# Patient Record
Sex: Female | Born: 1965 | Race: White | Hispanic: No | Marital: Married | Smoking: Never smoker
Health system: Southern US, Community
[De-identification: ages and names within clinical notes are randomized; demographics above are authoritative.]

## PROBLEM LIST (undated history)

## (undated) DIAGNOSIS — Z8669 Personal history of other diseases of the nervous system and sense organs: Secondary | ICD-10-CM

## (undated) DIAGNOSIS — I10 Essential (primary) hypertension: Secondary | ICD-10-CM

## (undated) DIAGNOSIS — E785 Hyperlipidemia, unspecified: Secondary | ICD-10-CM

## (undated) DIAGNOSIS — J45909 Unspecified asthma, uncomplicated: Secondary | ICD-10-CM

## (undated) HISTORY — PX: ABDOMINAL HYSTERECTOMY: SHX81

## (undated) HISTORY — PX: BREAST BIOPSY: SHX20

---

## 2004-08-01 ENCOUNTER — Other Ambulatory Visit: Payer: Self-pay

## 2004-08-01 ENCOUNTER — Emergency Department: Payer: Self-pay | Admitting: Emergency Medicine

## 2005-12-15 ENCOUNTER — Ambulatory Visit: Payer: Self-pay | Admitting: Family Medicine

## 2006-01-03 ENCOUNTER — Ambulatory Visit: Payer: Self-pay | Admitting: Family Medicine

## 2006-12-20 ENCOUNTER — Ambulatory Visit: Payer: Self-pay | Admitting: Family Medicine

## 2007-01-09 ENCOUNTER — Ambulatory Visit: Payer: Self-pay | Admitting: Family Medicine

## 2007-09-07 ENCOUNTER — Ambulatory Visit: Payer: Self-pay | Admitting: Family Medicine

## 2008-01-10 ENCOUNTER — Ambulatory Visit: Payer: Self-pay | Admitting: Family Medicine

## 2009-01-21 ENCOUNTER — Ambulatory Visit: Payer: Self-pay | Admitting: Family Medicine

## 2012-02-06 ENCOUNTER — Ambulatory Visit: Payer: Self-pay

## 2012-05-30 ENCOUNTER — Ambulatory Visit: Payer: Self-pay | Admitting: Family Medicine

## 2014-03-25 ENCOUNTER — Ambulatory Visit: Payer: Self-pay | Admitting: Family Medicine

## 2015-06-25 ENCOUNTER — Other Ambulatory Visit: Payer: Self-pay | Admitting: Family Medicine

## 2015-06-25 DIAGNOSIS — Z1231 Encounter for screening mammogram for malignant neoplasm of breast: Secondary | ICD-10-CM

## 2015-07-13 ENCOUNTER — Ambulatory Visit
Admission: RE | Admit: 2015-07-13 | Discharge: 2015-07-13 | Disposition: A | Discharge status: Home / Self Care | Payer: Managed Care, Other (non HMO) | Source: Ambulatory Visit | Attending: Family Medicine | Admitting: Family Medicine

## 2015-07-13 DIAGNOSIS — Z1231 Encounter for screening mammogram for malignant neoplasm of breast: Secondary | ICD-10-CM | POA: Diagnosis not present

## 2017-04-19 ENCOUNTER — Other Ambulatory Visit: Payer: Self-pay | Admitting: Family Medicine

## 2017-04-19 DIAGNOSIS — Z1231 Encounter for screening mammogram for malignant neoplasm of breast: Secondary | ICD-10-CM

## 2017-05-02 ENCOUNTER — Other Ambulatory Visit: Payer: Self-pay | Admitting: Family Medicine

## 2017-05-02 ENCOUNTER — Ambulatory Visit
Admission: RE | Admit: 2017-05-02 | Discharge: 2017-05-02 | Disposition: A | Discharge status: Home / Self Care | Payer: Managed Care, Other (non HMO) | Source: Ambulatory Visit | Attending: Family Medicine | Admitting: Family Medicine

## 2017-05-02 DIAGNOSIS — Z1231 Encounter for screening mammogram for malignant neoplasm of breast: Secondary | ICD-10-CM | POA: Diagnosis not present

## 2017-12-18 ENCOUNTER — Ambulatory Visit: Payer: 59 | Admitting: Anesthesiology

## 2017-12-18 ENCOUNTER — Ambulatory Visit
Admission: RE | Admit: 2017-12-18 | Discharge: 2017-12-18 | Disposition: A | Discharge status: Home / Self Care | Payer: 59 | Source: Ambulatory Visit | Attending: Unknown Physician Specialty | Admitting: Unknown Physician Specialty

## 2017-12-18 ENCOUNTER — Encounter
Admission: RE | Disposition: A | Discharge status: Home / Self Care | Payer: Self-pay | Source: Ambulatory Visit | Attending: Unknown Physician Specialty

## 2017-12-18 DIAGNOSIS — J45909 Unspecified asthma, uncomplicated: Secondary | ICD-10-CM | POA: Insufficient documentation

## 2017-12-18 DIAGNOSIS — K5730 Diverticulosis of large intestine without perforation or abscess without bleeding: Secondary | ICD-10-CM | POA: Insufficient documentation

## 2017-12-18 DIAGNOSIS — E785 Hyperlipidemia, unspecified: Secondary | ICD-10-CM | POA: Diagnosis not present

## 2017-12-18 DIAGNOSIS — Z79899 Other long term (current) drug therapy: Secondary | ICD-10-CM | POA: Insufficient documentation

## 2017-12-18 DIAGNOSIS — K640 First degree hemorrhoids: Secondary | ICD-10-CM | POA: Insufficient documentation

## 2017-12-18 DIAGNOSIS — I10 Essential (primary) hypertension: Secondary | ICD-10-CM | POA: Diagnosis not present

## 2017-12-18 DIAGNOSIS — Z791 Long term (current) use of non-steroidal anti-inflammatories (NSAID): Secondary | ICD-10-CM | POA: Diagnosis not present

## 2017-12-18 DIAGNOSIS — Z1211 Encounter for screening for malignant neoplasm of colon: Secondary | ICD-10-CM | POA: Diagnosis not present

## 2017-12-18 HISTORY — DX: Personal history of other diseases of the nervous system and sense organs: Z86.69

## 2017-12-18 HISTORY — DX: Essential (primary) hypertension: I10

## 2017-12-18 HISTORY — PX: COLONOSCOPY WITH PROPOFOL: SHX5780

## 2017-12-18 HISTORY — DX: Hyperlipidemia, unspecified: E78.5

## 2017-12-18 HISTORY — DX: Unspecified asthma, uncomplicated: J45.909

## 2017-12-18 SURGERY — COLONOSCOPY WITH PROPOFOL
Anesthesia: General

## 2017-12-18 MED ORDER — FENTANYL CITRATE (PF) 100 MCG/2ML IJ SOLN
INTRAMUSCULAR | Status: AC
  Filled 2017-12-18: qty 2

## 2017-12-18 MED ORDER — PROPOFOL 500 MG/50ML IV EMUL
INTRAVENOUS | Status: AC
  Filled 2017-12-18: qty 50

## 2017-12-18 MED ORDER — LIDOCAINE HCL (PF) 2 % IJ SOLN
INTRAMUSCULAR | Status: AC
  Filled 2017-12-18: qty 10

## 2017-12-18 MED ORDER — LIDOCAINE HCL (PF) 2 % IJ SOLN
INTRAMUSCULAR | Status: DC | PRN
  Administered 2017-12-18: 100 mg

## 2017-12-18 MED ORDER — PROPOFOL 500 MG/50ML IV EMUL
INTRAVENOUS | Status: DC | PRN
  Administered 2017-12-18: 50 ug/kg/min via INTRAVENOUS

## 2017-12-18 MED ORDER — SODIUM CHLORIDE 0.9 % IV SOLN
INTRAVENOUS | Status: DC
  Administered 2017-12-18: 13:00:00 via INTRAVENOUS

## 2017-12-18 MED ORDER — MIDAZOLAM HCL 2 MG/2ML IJ SOLN
INTRAMUSCULAR | Status: AC
  Filled 2017-12-18: qty 2

## 2017-12-18 MED ORDER — FENTANYL CITRATE (PF) 100 MCG/2ML IJ SOLN
INTRAMUSCULAR | Status: DC | PRN
  Administered 2017-12-18 (×2): 50 ug via INTRAVENOUS

## 2017-12-18 MED ORDER — MIDAZOLAM HCL 5 MG/5ML IJ SOLN
INTRAMUSCULAR | Status: DC | PRN
  Administered 2017-12-18: 2 mg via INTRAVENOUS

## 2017-12-18 MED ORDER — PROPOFOL 10 MG/ML IV BOLUS
INTRAVENOUS | Status: DC | PRN
  Administered 2017-12-18: 30 mg via INTRAVENOUS
  Administered 2017-12-18: 10 mg via INTRAVENOUS
  Administered 2017-12-18 (×2): 20 mg via INTRAVENOUS

## 2017-12-18 MED ORDER — SODIUM CHLORIDE 0.9 % IV SOLN
INTRAVENOUS | Status: DC
  Administered 2017-12-18: 1000 mL via INTRAVENOUS

## 2017-12-18 NOTE — Transfer of Care (Signed)
Immediate Anesthesia Transfer of Care Note  Patient: Joyce MilchSommer Jannine Johnson  Procedure(s) Performed: COLONOSCOPY WITH PROPOFOL (N/A )  Patient Location: PACU  Anesthesia Type:General  Level of Consciousness: sedated  Airway & Oxygen Therapy: Patient Spontanous Breathing and Patient connected to nasal cannula oxygen  Post-op Assessment: Report given to RN and Post -op Vital signs reviewed and stable  Post vital signs: Reviewed and stable  Last Vitals:  Vitals Value Taken Time  BP    Temp    Pulse    Resp    SpO2      Last Pain:  Vitals:   12/18/17 1228  TempSrc: Tympanic         Complications: No apparent anesthesia complications

## 2017-12-18 NOTE — Op Note (Signed)
[facility] Gastroenterology Patient Name: Joyce Johnson Procedure Date: 12/18/2017 12:22 PM MRN: [id number] Account #: [id number] Date of Birth: 11/03/1965 Admit Type: Outpatient Age: 52 Room: [facility] ENDO ROOM 1 Gender: Female Note Status: Finalized Procedure:            Colonoscopy Indications:          Screening for colorectal malignant neoplasm Providers:            Scot Jun, MD Referring MD:         No Local Md, MD (Referring MD) Medicines:            Propofol per Anesthesia Complications:        No immediate complications. Procedure:            Pre-Anesthesia Assessment:                       - After reviewing the risks and benefits, the patient                        was deemed in satisfactory condition to undergo the                        procedure.                       After obtaining informed consent, the colonoscope was                        passed under direct vision. Throughout the procedure,                        the patient's blood pressure, pulse, and oxygen                        saturations were monitored continuously. The                        Colonoscope was introduced through the anus and                        advanced to the the cecum, identified by appendiceal                        orifice and ileocecal valve. The colonoscopy was                        performed with difficulty due to restricted mobility of                        the colon. Successful completion of the procedure was                        aided by applying abdominal pressure. The patient                        tolerated the procedure well. The quality of the bowel                        preparation was excellent. Findings:      The adult scope was used first but I could not pass the  scope and we       switched to a pediatric scope and with some effort I waws able to pass       the scope to the cecum and into the ileocecal valve.      A few small-mouthed  diverticula were found in the sigmoid colon.      Internal hemorrhoids were found during endoscopy. The hemorrhoids were       small and Grade I (internal hemorrhoids that do not prolapse).      The exam was otherwise without abnormality. Impression:           - Diverticulosis in the sigmoid colon.                       - Internal hemorrhoids.                       - The examination was otherwise normal.                       - No specimens collected. Recommendation:       - Repeat colonoscopy in 10 years for screening purposes. Scot Junobert T Elliott, MD 12/18/2017 1:27:00 PM This report has been signed electronically. Number of Addenda: 0 Note Initiated On: 12/18/2017 12:22 PM Scope Withdrawal Time: 0 hours 8 minutes 9 seconds  Total Procedure Duration: 0 hours 30 minutes 36 seconds       [facility]

## 2017-12-18 NOTE — Anesthesia Preprocedure Evaluation (Signed)
Anesthesia Evaluation  Patient identified by MRN, date of birth, ID band Patient awake    Reviewed: Allergy & Precautions, NPO status , Patient's Chart, lab work & pertinent test results  History of Anesthesia Complications Negative for: history of anesthetic complications  Airway Mallampati: II  TM Distance: >3 FB Neck ROM: Full    Dental no notable dental hx.    Pulmonary asthma , neg sleep apnea,    breath sounds clear to auscultation- rhonchi (-) wheezing      Cardiovascular Exercise Tolerance: Good hypertension, (-) CAD, (-) Past MI, (-) Cardiac Stents and (-) CABG  Rhythm:Regular Rate:Normal - Systolic murmurs and - Diastolic murmurs    Neuro/Psych negative neurological ROS  negative psych ROS   GI/Hepatic negative GI ROS, Neg liver ROS,   Endo/Other  negative endocrine ROSneg diabetes  Renal/GU negative Renal ROS     Musculoskeletal negative musculoskeletal ROS (+)   Abdominal (+) + obese,   Peds  Hematology negative hematology ROS (+)   Anesthesia Other Findings Past Medical History: No date: Asthma No date: H/O Bell's palsy No date: Hyperlipidemia No date: Hypertension   Reproductive/Obstetrics                             Anesthesia Physical Anesthesia Plan  ASA: II  Anesthesia Plan: General   Post-op Pain Management:    Induction: Intravenous  PONV Risk Score and Plan: 2 and Propofol infusion  Airway Management Planned: Natural Airway  Additional Equipment:   Intra-op Plan:   Post-operative Plan:   Informed Consent: I have reviewed the patients History and Physical, chart, labs and discussed the procedure including the risks, benefits and alternatives for the proposed anesthesia with the patient or authorized representative who has indicated his/her understanding and acceptance.   Dental advisory given  Plan Discussed with: CRNA and  Anesthesiologist  Anesthesia Plan Comments:         Anesthesia Quick Evaluation

## 2017-12-18 NOTE — Anesthesia Post-op Follow-up Note (Signed)
Anesthesia QCDR form completed.        

## 2017-12-18 NOTE — Anesthesia Postprocedure Evaluation (Signed)
Anesthesia Post Note  Patient: Joyce Johnson [name]  Procedure(s) Performed: COLONOSCOPY WITH PROPOFOL (N/A )  Patient location during evaluation: Endoscopy Anesthesia Type: General Level of consciousness: awake and alert and oriented Pain management: pain level controlled Vital Signs Assessment: post-procedure vital signs reviewed and stable Respiratory status: spontaneous breathing, nonlabored ventilation and respiratory function stable Cardiovascular status: blood pressure returned to baseline and stable Postop Assessment: no signs of nausea or vomiting Anesthetic complications: no     Last Vitals:  Vitals:   12/18/17 1336 12/18/17 1346  BP: 123/84 (!) 133/94  Pulse: (!) 59 62  Resp: 12 11  Temp:    SpO2: 99% 99%    Last Pain:  Vitals:   12/18/17 1346  TempSrc:   PainSc: 0-No pain                 Zenon Leaf

## 2017-12-18 NOTE — H&P (Signed)
Primary Care Physician:  Lazarus GowdaGoeres, Lindsey W, MD Primary Gastroenterologist:  Dr. Mechele CollinElliott  Pre-Procedure History & Physical: HPI:  Joyce Johnson is a 52 y.o. female is here for an colonoscopy.   Past Medical History:  Diagnosis Date  . Asthma   . H/O Bell's palsy   . Hyperlipidemia   . Hypertension     Past Surgical History:  Procedure Laterality Date  . ABDOMINAL HYSTERECTOMY    . BREAST BIOPSY Right 1990& 1992   bx/x2-benign    Prior to Admission medications   Medication Sig Start Date End Date Taking? Authorizing Provider  acyclovir (ZOVIRAX) 800 MG tablet Take 800 mg by mouth 2 (two) times daily.   Yes [provider]  fluticasone (FLONASE) 50 MCG/ACT nasal spray Place 2 sprays into both nostrils daily.   Yes [provider]  fluticasone furoate-vilanterol (BREO ELLIPTA) 200-25 MCG/INH AEPB Inhale 1 puff into the lungs daily.   Yes [provider]  ibuprofen (ADVIL,MOTRIN) 800 MG tablet Take 800 mg by mouth every 8 (eight) hours as needed.   Yes [provider]  Melatonin 3 MG TABS Take 1 tablet by mouth at bedtime as needed.   Yes [provider]  Multiple Vitamins-Minerals (MULTIVITAMIN WITH MINERALS) tablet Take 1 tablet by mouth daily.   Yes [provider]  naproxen sodium (ALEVE) 220 MG tablet Take 220 mg by mouth daily as needed (2 tablets daily prn).   Yes [provider]    Allergies as of 11/10/2017  . (Not on File)    Family History  Problem Relation Age of Onset  . Breast cancer Mother 3668  . Breast cancer Maternal Aunt        mat aunt and great aunt    Social History   Socioeconomic History  . Marital status: Married    Spouse name: Not on file  . Number of children: Not on file  . Years of education: Not on file  . Highest education level: Not on file  Occupational History  . Not on file  Social Needs  . Financial resource strain: Not on file  . Food insecurity:    Worry:  Not on file    Inability: Not on file  . Transportation needs:    Medical: Not on file    Non-medical: Not on file  Tobacco Use  . Smoking status: Never Smoker  . Smokeless tobacco: Never Used  Substance and Sexual Activity  . Alcohol use: Not on file    Comment: 1 drink per week  . Drug use: Not on file  . Sexual activity: Not on file  Lifestyle  . Physical activity:    Days per week: Not on file    Minutes per session: Not on file  . Stress: Not on file  Relationships  . Social connections:    Talks on phone: Not on file    Gets together: Not on file    Attends religious service: Not on file    Active member of club or organization: Not on file    Attends meetings of clubs or organizations: Not on file    Relationship status: Not on file  . Intimate partner violence:    Fear of current or ex partner: Not on file    Emotionally abused: Not on file    Physically abused: Not on file    Forced sexual activity: Not on file  Other Topics Concern  . Not on file  Social History Narrative  .  Not on file    Review of Systems: See HPI, otherwise negative ROS  Physical Exam: There were no vitals taken for this visit. General:   Alert,  pleasant and cooperative in NAD Head:  Normocephalic and atraumatic. Neck:  Supple; no masses or thyromegaly. Lungs:  Clear throughout to auscultation.    Heart:  Regular rate and rhythm. Abdomen:  Soft, nontender and nondistended. Normal bowel sounds, without guarding, and without rebound.   Neurologic:  Alert and  oriented x4;  grossly normal neurologically.  Impression/Plan: Joyce Cruise is here for an colonoscopy to be performed for colon cancer screening.  Risks, benefits, limitations, and alternatives regarding  colonoscopy have been reviewed with the patient.  Questions have been answered.  All parties agreeable.   Lynnae Prude, MD  12/18/2017, 12:24 PM

## 2017-12-22 ENCOUNTER — Encounter: Payer: Self-pay | Admitting: Unknown Physician Specialty

## 2020-07-21 ENCOUNTER — Other Ambulatory Visit: Payer: Self-pay | Admitting: Family Medicine

## 2020-07-21 DIAGNOSIS — Z1231 Encounter for screening mammogram for malignant neoplasm of breast: Secondary | ICD-10-CM

## 2020-07-29 ENCOUNTER — Ambulatory Visit
Admission: RE | Admit: 2020-07-29 | Discharge: 2020-07-29 | Disposition: A | Discharge status: Home / Self Care | Payer: BC Managed Care – PPO | Source: Ambulatory Visit | Attending: Family Medicine | Admitting: Family Medicine

## 2020-07-29 ENCOUNTER — Other Ambulatory Visit: Payer: Self-pay

## 2020-07-29 DIAGNOSIS — Z1231 Encounter for screening mammogram for malignant neoplasm of breast: Secondary | ICD-10-CM | POA: Diagnosis present

## 2021-07-04 ENCOUNTER — Emergency Department
Admission: EM | Admit: 2021-07-04 | Discharge: 2021-07-04 | Disposition: A | Discharge status: Home / Self Care | Payer: BC Managed Care – PPO | Attending: Emergency Medicine | Admitting: Emergency Medicine

## 2021-07-04 ENCOUNTER — Other Ambulatory Visit: Payer: Self-pay

## 2021-07-04 ENCOUNTER — Emergency Department: Payer: BC Managed Care – PPO

## 2021-07-04 DIAGNOSIS — J45909 Unspecified asthma, uncomplicated: Secondary | ICD-10-CM | POA: Diagnosis not present

## 2021-07-04 DIAGNOSIS — I10 Essential (primary) hypertension: Secondary | ICD-10-CM | POA: Diagnosis not present

## 2021-07-04 DIAGNOSIS — W1839XA Other fall on same level, initial encounter: Secondary | ICD-10-CM | POA: Insufficient documentation

## 2021-07-04 DIAGNOSIS — S52502A Unspecified fracture of the lower end of left radius, initial encounter for closed fracture: Secondary | ICD-10-CM | POA: Diagnosis not present

## 2021-07-04 DIAGNOSIS — S6992XA Unspecified injury of left wrist, hand and finger(s), initial encounter: Secondary | ICD-10-CM | POA: Diagnosis present

## 2021-07-04 MED ORDER — ONDANSETRON 4 MG PO TBDP: 4.0000 mg | ORAL_TABLET | Freq: Three times a day (TID) | ORAL | 0 refills | Status: AC | PRN

## 2021-07-04 MED ORDER — ATROPINE SULFATE 1 MG/ML IV SOLN: 0.2000 mg | Freq: Once | INTRAVENOUS | Status: DC

## 2021-07-04 MED ORDER — KETOROLAC TROMETHAMINE 60 MG/2ML IM SOLN
60.0000 mg | Freq: Once | INTRAMUSCULAR | Status: AC
  Administered 2021-07-04: 60 mg via INTRAMUSCULAR
  Filled 2021-07-04: qty 2

## 2021-07-04 MED ORDER — OXYCODONE-ACETAMINOPHEN 5-325 MG PO TABS: 1.0000 | ORAL_TABLET | ORAL | 0 refills | Status: AC | PRN

## 2021-07-04 NOTE — ED Triage Notes (Signed)
Pt states she tripped and fell backwards and tried to catch herself and hurt her L wrist- pt has obvious deformity to L wrist

## 2021-07-04 NOTE — ED Notes (Signed)
Pt verbalized understanding of discharge instructions, prescriptions, and follow-up care instructions. Pt advised if symptoms worsen to return to ED.  

## 2021-07-04 NOTE — Discharge Instructions (Addendum)
Please call the number provided for orthopedic surgery on Monday to arrange a follow-up appointment this week.  Please take your pain and nausea medication as needed as written.  Return to the emergency department for any worsening pain or any other symptom personally concerning to yourself.

## 2021-07-04 NOTE — ED Provider Notes (Signed)
   [facility] Provider Note    Event Date/Time   First MD Initiated Contact with Patient 07/04/21 1902     (approximate)  History   Chief Complaint: Wrist Pain  HPI  Isabel Vandergrift is a 56 y.o. female with a past medical history of asthma, hypertension, hyperlipidemia, presents to the emergency department for left wrist pain and deformity.  According to the patient she fell backwards catching herself with her left hand.  Patient states immediate pain and swelling to the left wrist.  Denies hitting her head or LOC.  Physical Exam   Triage Vital Signs: ED Triage Vitals  Enc Vitals Group     BP 07/04/21 1839 (!) 190/115     Pulse Rate 07/04/21 1839 74     Resp 07/04/21 1839 18     Temp 07/04/21 1839 98.3 F (36.8 C)     Temp Source 07/04/21 1839 Oral     SpO2 07/04/21 1839 99 %     Weight 07/04/21 1840 186 lb (84.4 kg)     Height 07/04/21 1840 5\' 1"  (1.549 m)     Head Circumference --      Peak Flow --      Pain Score 07/04/21 1840 10     Pain Loc --      Pain Edu? --      Excl. in [place]? --     Most recent vital signs: Vitals:   07/04/21 1839  BP: (!) 190/115  Pulse: 74  Resp: 18  Temp: 98.3 F (36.8 C)  SpO2: 99%    General: Awake, no distress.  CV:  Good peripheral perfusion.  Regular rate and rhythm  Resp:  Normal effort.  Equal breath sounds bilaterally.  Abd:  No distention.  Soft, nontender.  No rebound or guarding. Other:  Mild swelling with moderate tenderness to palpation over the left wrist.  No laceration.   ED Results / Procedures / Treatments   RADIOLOGY  I have reviewed the wrist x-ray images patient appears to have a distal radius fracture.  Radiology has read the x-ray is acute comminuted fracture of the distal radius with intra-articular extension   MEDICATIONS ORDERED IN ED: Medications  ketorolac (TORADOL) injection 60 mg (60 mg Intramuscular Given 07/04/21 1952)     IMPRESSION / MDM / [place] /  ED COURSE  I reviewed the triage vital signs and the nursing notes.  Patient's presentation is most consistent with acute presentation with potential threat to life or bodily function.  Patient presents emergency department cute onset of left wrist pain and swelling after a fall and landing on the left wrist.  Exam most consistent with likely fracture.  I reviewed the x-ray images appears to have a distal radius fracture that extends into the intra-articular surface. I spoke to Dr. Harlow Mares, we will place the patient in a sugar-tong splint and sling.  I did attempt to form the splint somewhat as well.  We will have the patient follow-up with Dr. Harlow Mares on Monday.  We will prescribe pain medication.  Patient does not want any narcotic pain medications tonight as she is going to drive home.  FINAL CLINICAL IMPRESSION(S) / ED DIAGNOSES   Distal radius fracture, left  Rx / DC Orders   Percocet Zofran Dr. Harlow Mares follow-up  Note:  This document was prepared using Dragon voice recognition software and may include unintentional dictation errors.   Harvest Dark, MD 07/04/21 2107

## 2021-07-04 NOTE — ED Notes (Signed)
Pt with left wrist deformity.

## 2022-03-28 ENCOUNTER — Ambulatory Visit
Admission: EM | Admit: 2022-03-28 | Discharge: 2022-03-28 | Disposition: A | Discharge status: Home / Self Care | Payer: BC Managed Care – PPO | Attending: Physician Assistant | Admitting: Physician Assistant

## 2022-03-28 DIAGNOSIS — Z1152 Encounter for screening for COVID-19: Secondary | ICD-10-CM | POA: Insufficient documentation

## 2022-03-28 DIAGNOSIS — B349 Viral infection, unspecified: Secondary | ICD-10-CM | POA: Insufficient documentation

## 2022-03-28 DIAGNOSIS — R0981 Nasal congestion: Secondary | ICD-10-CM | POA: Diagnosis present

## 2022-03-28 DIAGNOSIS — R051 Acute cough: Secondary | ICD-10-CM | POA: Diagnosis not present

## 2022-03-28 LAB — RESP PANEL BY RT-PCR (RSV, FLU A&B, COVID)  RVPGX2
Influenza A by PCR: NEGATIVE
Influenza B by PCR: NEGATIVE
Resp Syncytial Virus by PCR: NEGATIVE
SARS Coronavirus 2 by RT PCR: NEGATIVE

## 2022-03-28 NOTE — ED Provider Notes (Signed)
[facility]    CSN: [id number] Arrival date & time: 03/28/22  1027      History   Chief Complaint Chief Complaint  Patient presents with   Nasal Congestion   Cough    HPI Joyce Johnson is a 57 y.o. female presenting for 2 to 3-day history of fatigue, headaches, cough, congestion and bodyaches.  She denies fever.  Reports a mild sore throat but believes it to be due to drainage.  Denies breathing difficulty, vomiting or diarrhea.  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  She requests testing for flu and COVID.  Has not been taking over-the-counter medication.  No other complaints.  HPI  Past Medical History:  Diagnosis Date   Asthma    H/O Bell's palsy    Hyperlipidemia    Hypertension     There are no problems to display for this patient.   Past Surgical History:  Procedure Laterality Date   ABDOMINAL HYSTERECTOMY     BREAST BIOPSY Right 1990& 1992   bx/x2-benign   COLONOSCOPY WITH PROPOFOL N/A 12/18/2017   Procedure: COLONOSCOPY WITH PROPOFOL;  Surgeon: Manya Silvas, MD;  Location: [facility] ENDOSCOPY;  Service: Endoscopy;  Laterality: N/A;    OB History   No obstetric history on file.      Home Medications    Prior to Admission medications   Medication Sig Start Date End Date Taking? Authorizing Provider  acyclovir (ZOVIRAX) 800 MG tablet Take 800 mg by mouth 2 (two) times daily.   Yes [provider]  albuterol (VENTOLIN HFA) 108 (90 Base) MCG/ACT inhaler Inhale into the lungs. 03/25/20  Yes [provider]  fluticasone (FLONASE) 50 MCG/ACT nasal spray Place 2 sprays into both nostrils daily.   Yes [provider]  ibuprofen (ADVIL,MOTRIN) 800 MG tablet Take 800 mg by mouth every 8 (eight) hours as needed.   Yes [provider]  fluticasone furoate-vilanterol (BREO ELLIPTA) 200-25 MCG/INH AEPB Inhale 1 puff into the lungs daily.    [provider]  Melatonin  3 MG TABS Take 1 tablet by mouth at bedtime as needed.    [provider]  Multiple Vitamins-Minerals (MULTIVITAMIN WITH MINERALS) tablet Take 1 tablet by mouth daily.    [provider]  naproxen sodium (ALEVE) 220 MG tablet Take 220 mg by mouth daily as needed (2 tablets daily prn).    [provider]  ondansetron (ZOFRAN-ODT) 4 MG disintegrating tablet Take 1 tablet (4 mg total) by mouth every 8 (eight) hours as needed for nausea or vomiting. 07/04/21   Harvest Dark, MD  oxyCODONE-acetaminophen (PERCOCET) 5-325 MG tablet Take 1 tablet by mouth every 4 (four) hours as needed for severe pain. 07/04/21   Harvest Dark, MD    Family History Family History  Problem Relation Age of Onset   Breast cancer Mother 22   Breast cancer Maternal Aunt        mat aunt and great aunt    Social History Social History   Tobacco Use   Smoking status: Never   Smokeless tobacco: Never  Vaping Use   Vaping Use: Never used  Substance Use Topics   Alcohol use: Yes    Comment: 1 drink per week   Drug use: Never     Allergies   Latex   Review of Systems Review of Systems  Constitutional:  Positive for fatigue. Negative for chills, diaphoresis and fever.  HENT:  Positive for  congestion, postnasal drip, rhinorrhea and sore throat. Negative for ear pain, sinus pressure and sinus pain.   Respiratory:  Positive for cough. Negative for shortness of breath.   Gastrointestinal:  Negative for abdominal pain, nausea and vomiting.  Musculoskeletal:  Negative for arthralgias and myalgias.  Skin:  Negative for rash.  Neurological:  Positive for headaches. Negative for weakness.  Hematological:  Negative for adenopathy.     Physical Exam Triage Vital Signs ED Triage Vitals  Enc Vitals Group     BP      Pulse      Resp      Temp      Temp src      SpO2      Weight      Height      Head Circumference      Peak Flow      Pain Score      Pain Loc      Pain Edu?       Excl. in [place]?    No data found.  Updated Vital Signs BP (!) 166/99 (BP Location: Left Arm)   Pulse 74   Temp 97.9 F (36.6 C) (Oral)   Resp 18   SpO2 98%     Physical Exam Vitals and nursing note reviewed.  Constitutional:      General: She is not in acute distress.    Appearance: Normal appearance. She is not ill-appearing or toxic-appearing.  HENT:     Head: Normocephalic and atraumatic.     Right Ear: Tympanic membrane, ear canal and external ear normal.     Left Ear: Tympanic membrane, ear canal and external ear normal.     Nose: Congestion present.     Mouth/Throat:     Mouth: Mucous membranes are moist.     Pharynx: Oropharynx is clear. Posterior oropharyngeal erythema (mild with clear PND) present.  Eyes:     General: No scleral icterus.       Right eye: No discharge.        Left eye: No discharge.     Conjunctiva/sclera: Conjunctivae normal.  Cardiovascular:     Rate and Rhythm: Normal rate and regular rhythm.     Heart sounds: Normal heart sounds.  Pulmonary:     Effort: Pulmonary effort is normal. No respiratory distress.     Breath sounds: Normal breath sounds.  Musculoskeletal:     Cervical back: Neck supple.  Skin:    General: Skin is dry.  Neurological:     General: No focal deficit present.     Mental Status: She is alert. Mental status is at baseline.     Motor: No weakness.     Gait: Gait normal.  Psychiatric:        Mood and Affect: Mood normal.        Behavior: Behavior normal.        Thought Content: Thought content normal.      UC Treatments / Results  Labs (all labs ordered are listed, but only abnormal results are displayed) Labs Reviewed  RESP PANEL BY RT-PCR (RSV, FLU A&B, COVID)  [id number]    EKG   Radiology No results found.  Procedures Procedures (including critical care time)  Medications Ordered in UC Medications - No data to display  Initial Impression / Assessment and Plan / UC Course  I have reviewed the  triage vital signs and the nursing notes.  Pertinent labs & imaging results that were available during my  care of the patient were reviewed by me and considered in my medical decision making (see chart for details).   57 year old female presents for 2 to 3-day history of fatigue, cough, congestion, sore throat.  Works as a Government social research officer.  She is afebrile and overall well-appearing.  Exam is nasal congestion and mild posterior pharyngeal erythema.  Respiratory panel obtained.  Vies patient I will contact her with the results.  Reviewed current CDC guidelines, isolation protocol and ED precautions if COVID-positive.  Offer cough medication but she declined.  All negative x-ray panel.  Contacted patient via phone and discussed the results.  Advised her she may have a viral URI.  Supportive care encouraged with OTC meds as advised   Final Clinical Impressions(s) / UC Diagnoses   Final diagnoses:  Viral illness  Nasal congestion  Acute cough     Discharge Instructions      URI/COLD SYMPTOMS: Your exam today is consistent with a viral illness. Antibiotics are not indicated at this time. Use medications as directed, including cough syrup, nasal saline, and decongestants. Your symptoms should improve over the next few days and resolve within 7-10 days. Increase rest and fluids. F/u if symptoms worsen or predominate such as sore throat, ear pain, productive cough, shortness of breath, or if you develop high fevers or worsening fatigue over the next several days.    I will call you with the results of your respiratory panel.  If you are positive for COVID you will need to isolate 5 days from symptom onset and then wear a mask for 5 days.  If you have flu you should be out for couple days until you are feeling better.  If those tests are all negative then you have a cold and you should be feeling better within a week or so.  You may stay home and rest or return to work.     ED Prescriptions    None    PDMP not reviewed this encounter.   Danton Clap, PA-C 03/28/22 1315

## 2022-03-28 NOTE — ED Triage Notes (Signed)
3 day history of cough,nasal congestion and body aches. 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

## 2022-03-28 NOTE — Discharge Instructions (Signed)
URI/COLD SYMPTOMS: Your exam today is consistent with a viral illness. Antibiotics are not indicated at this time. Use medications as directed, including cough syrup, nasal saline, and decongestants. Your symptoms should improve over the next few days and resolve within 7-10 days. Increase rest and fluids. F/u if symptoms worsen or predominate such as sore throat, ear pain, productive cough, shortness of breath, or if you develop high fevers or worsening fatigue over the next several days.    I will call you with the results of your respiratory panel.  If you are positive for COVID you will need to isolate 5 days from symptom onset and then wear a mask for 5 days.  If you have flu you should be out for couple days until you are feeling better.  If those tests are all negative then you have a cold and you should be feeling better within a week or so.  You may stay home and rest or return to work.

## 2023-07-19 IMAGING — DX DG WRIST COMPLETE 3+V*L*
4 series · 4 of 4 positions shown · non-contrast
Comparison: None Available.

CLINICAL DATA: Status post fall.

EXAM:
LEFT WRIST - COMPLETE 3+ VIEW

[wrist ap]
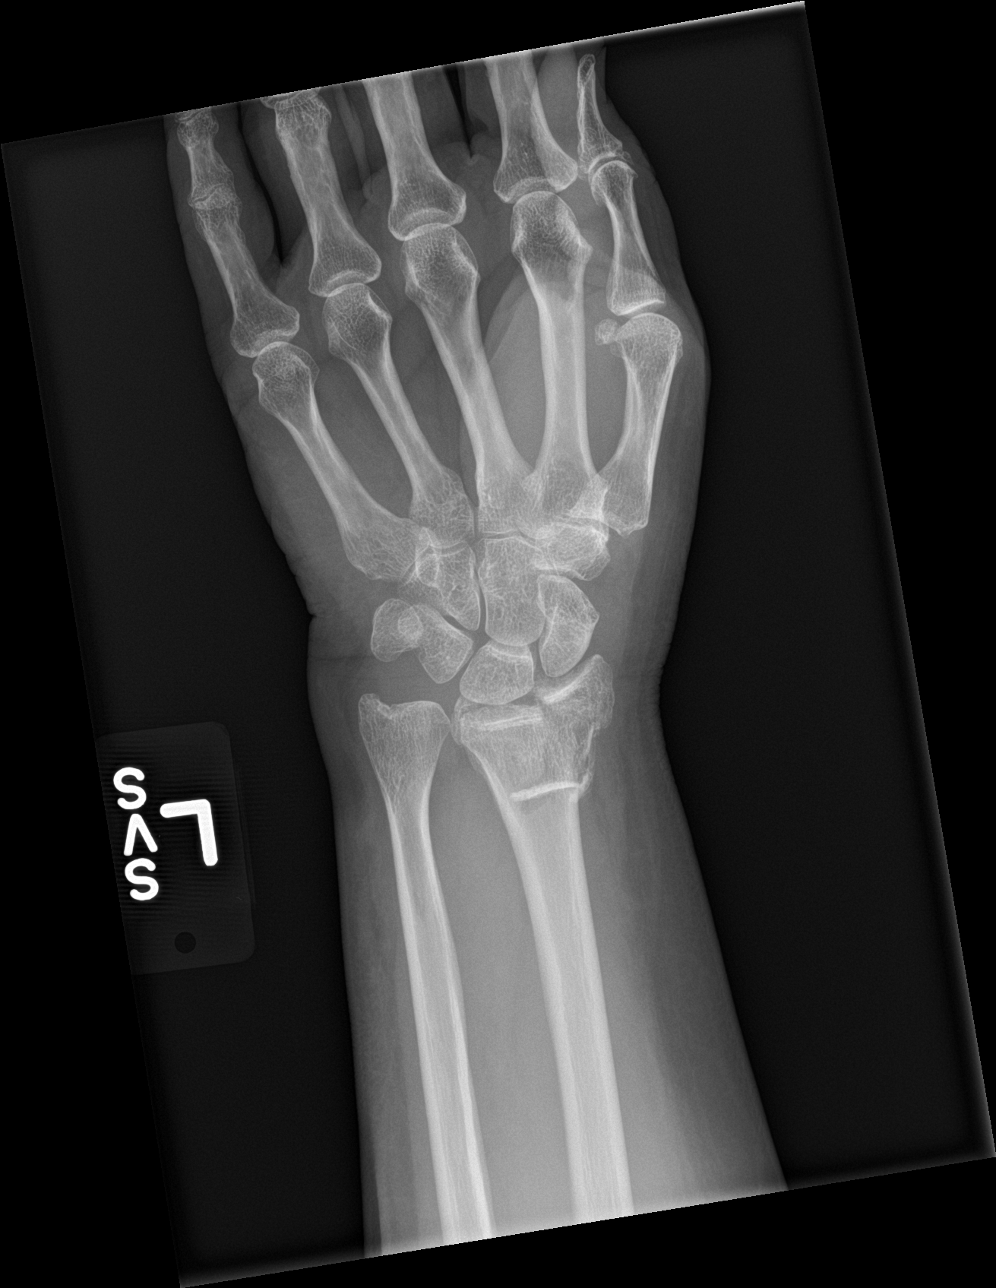

[wrist obl]
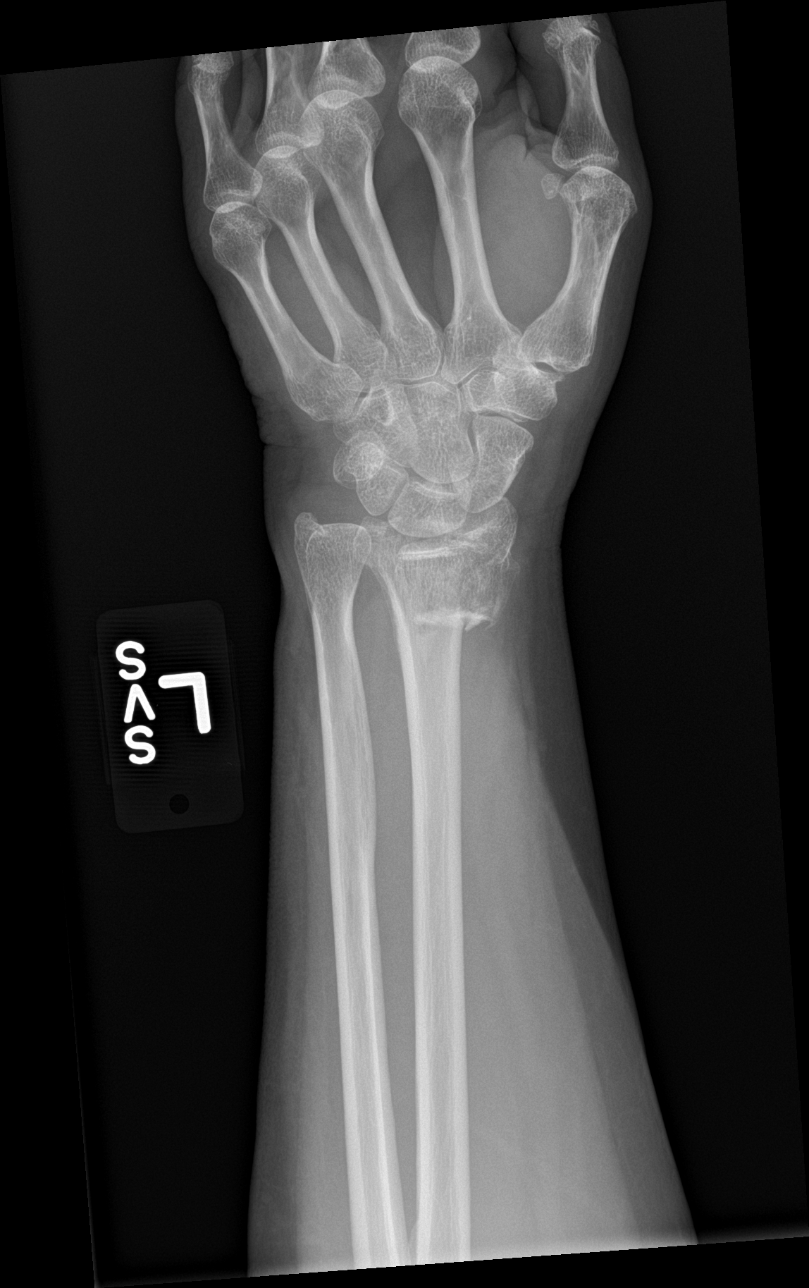

[wrist lat (1 of 2)]
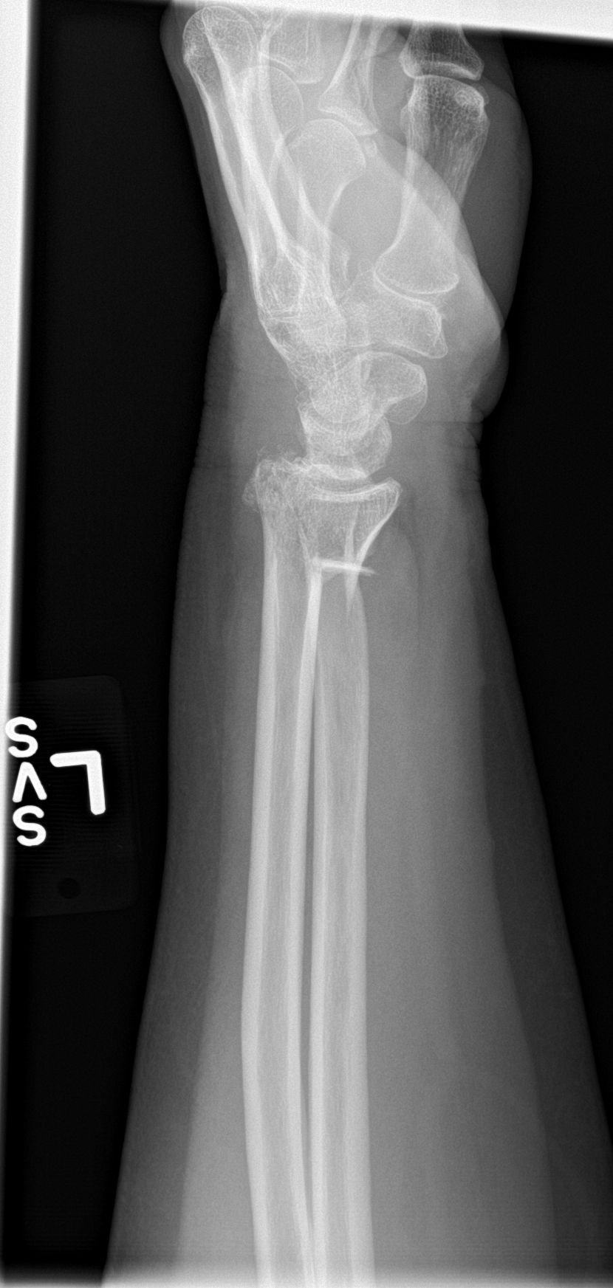

[wrist lat (2 of 2)]
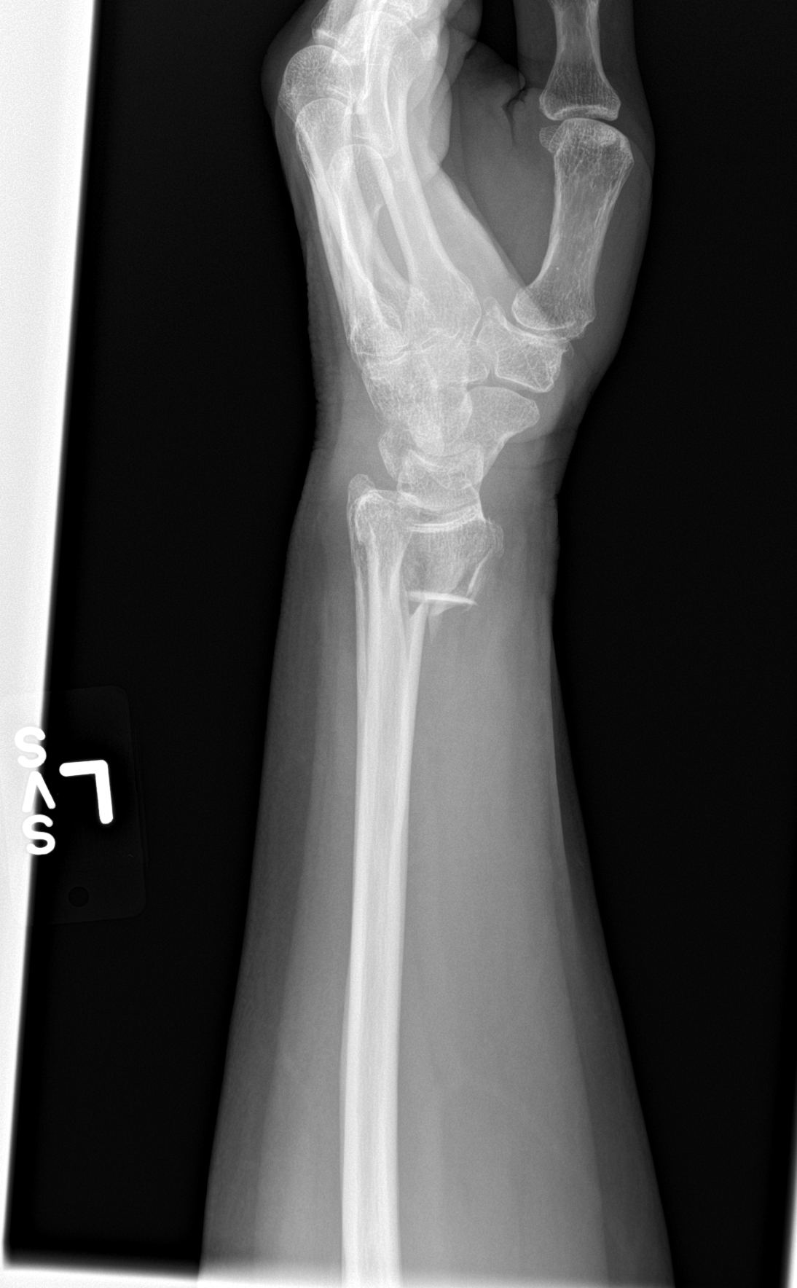

[4 of 4 positions shown; findings below may reference images not displayed]

FINDINGS: An acute, comminuted fracture deformity is seen involving the distal
left radius. There is extension to involve the radiocarpal
articulation. There is no evidence of dislocation. Mild diffuse soft
tissue swelling is seen.
IMPRESSION: Acute, comminuted fracture of the distal left radius with
intra-articular extension.
